# Patient Record
Sex: Female | Born: 1990 | Race: White | Hispanic: No | Marital: Married
Health system: Southern US, Community
[De-identification: ages and names within clinical notes are randomized; demographics above are authoritative.]

## PROBLEM LIST (undated history)

## (undated) DIAGNOSIS — J45909 Unspecified asthma, uncomplicated: Secondary | ICD-10-CM

## (undated) HISTORY — DX: Unspecified asthma, uncomplicated: J45.909

---
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
## 2013-03-16 ENCOUNTER — Other Ambulatory Visit (HOSPITAL_COMMUNITY)
Admission: RE | Admit: 2013-03-16 | Discharge: 2013-03-16 | Disposition: A | Discharge status: Home / Self Care | Payer: 59 | Source: Ambulatory Visit | Attending: Family Medicine | Admitting: Family Medicine

## 2013-03-16 DIAGNOSIS — Z124 Encounter for screening for malignant neoplasm of cervix: Secondary | ICD-10-CM | POA: Insufficient documentation

## 2013-03-16 DIAGNOSIS — Z113 Encounter for screening for infections with a predominantly sexual mode of transmission: Secondary | ICD-10-CM | POA: Insufficient documentation

## 2016-06-12 ENCOUNTER — Other Ambulatory Visit: Payer: Self-pay | Admitting: Family Medicine

## 2016-06-12 ENCOUNTER — Ambulatory Visit
Admission: RE | Admit: 2016-06-12 | Discharge: 2016-06-12 | Disposition: A | Discharge status: Home / Self Care | Payer: BLUE CROSS/BLUE SHIELD | Source: Ambulatory Visit | Attending: Family Medicine | Admitting: Family Medicine

## 2016-06-12 DIAGNOSIS — S99911A Unspecified injury of right ankle, initial encounter: Secondary | ICD-10-CM

## 2016-08-21 ENCOUNTER — Ambulatory Visit
Admission: RE | Admit: 2016-08-21 | Discharge: 2016-08-21 | Disposition: A | Discharge status: Home / Self Care | Payer: BLUE CROSS/BLUE SHIELD | Source: Ambulatory Visit | Attending: Family Medicine | Admitting: Family Medicine

## 2016-08-21 ENCOUNTER — Other Ambulatory Visit: Payer: Self-pay | Admitting: Family Medicine

## 2016-08-21 DIAGNOSIS — R059 Cough, unspecified: Secondary | ICD-10-CM

## 2016-08-21 DIAGNOSIS — R05 Cough: Secondary | ICD-10-CM

## 2018-06-09 ENCOUNTER — Ambulatory Visit: Payer: Managed Care, Other (non HMO) | Admitting: Physical Therapy

## 2018-06-13 ENCOUNTER — Ambulatory Visit: Payer: Managed Care, Other (non HMO) | Admitting: Physical Therapy

## 2018-06-15 ENCOUNTER — Other Ambulatory Visit: Payer: Self-pay

## 2018-06-15 ENCOUNTER — Encounter: Payer: Self-pay | Admitting: Physical Therapy

## 2018-06-15 ENCOUNTER — Ambulatory Visit: Payer: Managed Care, Other (non HMO) | Attending: Family Medicine | Admitting: Physical Therapy

## 2018-06-15 DIAGNOSIS — G8929 Other chronic pain: Secondary | ICD-10-CM

## 2018-06-15 DIAGNOSIS — M25511 Pain in right shoulder: Secondary | ICD-10-CM | POA: Insufficient documentation
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
## 2018-06-22 ENCOUNTER — Ambulatory Visit: Payer: Managed Care, Other (non HMO) | Admitting: Physical Therapy

## 2018-06-22 ENCOUNTER — Other Ambulatory Visit: Payer: Self-pay

## 2018-06-22 ENCOUNTER — Encounter: Payer: Self-pay | Admitting: Physical Therapy

## 2018-06-22 DIAGNOSIS — M25511 Pain in right shoulder: Secondary | ICD-10-CM | POA: Diagnosis not present

## 2018-06-22 DIAGNOSIS — G8929 Other chronic pain: Secondary | ICD-10-CM

## 2018-06-22 NOTE — Therapy (Signed)
[place], [place], [address] Phone: [phone]   Fax:  [phone]  Physical Therapy Treatment  Patient Details  Name: Anna Herring MRN: [id number] Date of Birth: [birth year] Referring Provider (PT): Jonathon Jordan, MD   Encounter Date: 06/22/2018  PT End of Session - 06/22/18 1332    Visit Number  2    Number of Visits  5    Date for PT Re-Evaluation  07/15/18    Authorization Type  [employer]IG[employer], no VL    PT Start Time  1330    PT Stop Time  1355    PT Time Calculation (min)  25 min    Activity Tolerance  Patient tolerated treatment well    Behavior During Therapy  [facility] for tasks assessed/performed       Past Medical History:  Diagnosis Date  . Asthma     Past Surgical History:  Procedure Laterality Date  . WISDOM TOOTH EXTRACTION Bilateral 2008    There were no vitals filed for this visit.  Subjective Assessment - 06/22/18 1333    Subjective  No pain today, exercises went well, got thoracic brace for being on computer.     Patient Stated Goals  avoid frozen shoulder    Currently in Pain?  No/denies                       [facility] Adult PT Treatment/Exercise - 06/22/18 0001      Shoulder Exercises: Prone   Other Prone Exercises  qped: CKC circles on ball, abd with head turn, GHJ flexion, plank row      Shoulder Exercises: Standing   Other Standing Exercises  wall angels, Ys, abd stretch               PT Short Term Goals - 06/15/18 1426      PT SHORT TERM GOAL #1   Title  STG = LTG        PT Long Term Goals - 06/15/18 1426      PT LONG TERM GOAL #1   Title  Pt will be independent in long term HEP    Baseline  began establishing at eval    Time  4    Period  Weeks    Status  New    Target Date  07/15/18      PT LONG TERM GOAL #2   Title  Pt will demo resting posture without scapular winging    Baseline  Rt winging at eval    Time  4    Period  Weeks     Status  New    Target Date  07/15/18      PT LONG TERM GOAL #3   Title  unform arc of motion in passive GHJ IR/ER    Baseline  ER significantly > IR at eval    Time  4    Period  Weeks    Status  New    Target Date  07/15/18            Plan - 06/22/18 1403    Clinical Impression Statement  Continued to add exercises for large ranges of motion as well as stabilization by encouraging periscapular activation. Good tolerance to exercises and options for progression and decreasing intensity. Will cancel appointment next week and f/u in 2 weeks.     PT Treatment/Interventions  ADLs/Self Care Home Management;Cryotherapy;Moist Heat;Iontophoresis 4mg /ml Dexamethasone;Electrical Stimulation;Functional mobility training;Neuromuscular re-education;Therapeutic exercise;Therapeutic  activities;Patient/family education;Manual techniques;Dry needling;Passive range of motion;Taping    PT Next Visit Plan  sleeper stretch, lifting, OH stabilization    PT Home Exercise Plan  plank serratus press, door stretch, ER red tband, triceps kick tband, scap retraction; see scanned pt instructions    Consulted and Agree with Plan of Care  Patient       Patient will benefit from skilled therapeutic intervention in order to improve the following deficits and impairments:  Improper body mechanics, Pain, Postural dysfunction, Decreased activity tolerance  Visit Diagnosis: Chronic right shoulder pain     Problem List There are no active problems to display for this patient.  Yaw Escoto C. Brennan Karam PT, DPT 06/22/18 2:13 PM   [facility] [address] [place], [place], [address] Phone: [phone]   Fax:  [phone]  Name: Anna Herring MRN: [id number] Date of Birth: Oct 18, 1990

## 2018-06-29 ENCOUNTER — Ambulatory Visit: Payer: Managed Care, Other (non HMO) | Admitting: Physical Therapy

## 2018-07-06 ENCOUNTER — Ambulatory Visit: Payer: Managed Care, Other (non HMO) | Admitting: Physical Therapy

## 2018-07-13 ENCOUNTER — Other Ambulatory Visit: Payer: Self-pay

## 2018-07-13 ENCOUNTER — Ambulatory Visit: Payer: Managed Care, Other (non HMO) | Attending: Family Medicine | Admitting: Physical Therapy

## 2018-07-13 ENCOUNTER — Encounter: Payer: Self-pay | Admitting: Physical Therapy

## 2018-07-13 DIAGNOSIS — M25511 Pain in right shoulder: Secondary | ICD-10-CM | POA: Diagnosis not present

## 2018-07-13 DIAGNOSIS — G8929 Other chronic pain: Secondary | ICD-10-CM | POA: Insufficient documentation

## 2018-07-13 NOTE — Therapy (Signed)
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 Asthma     Past Surgical History:  Procedure Laterality Date  . WISDOM TOOTH EXTRACTION Bilateral 2008    There were no vitals filed for this visit.  Subjective Assessment - 07/13/18 1629    Subjective  Denies pain overall.    Currently in Pain?  No/denies         [facility] PT Assessment - 07/13/18 0001      Assessment   Medical Diagnosis  Rt RC injury    Referring Provider (PT)  Jonathon Jordan, MD      PROM   Overall PROM Comments  equal arc of motion in passive IR/ER      Strength   Overall Strength Comments  gross strength 5/5      Palpation   Palpation comment  denies [facility]TP at [facility] joint                           PT Education - 07/13/18 1639    Education Details  importance of continuing HEP    Person(s) Educated  Patient    Methods  Explanation    Comprehension  Verbalized understanding       PT Short Term Goals - 06/15/18 1426      PT SHORT TERM GOAL #1   Title  STG = LTG        PT Long Term Goals - 07/13/18 1642      PT LONG TERM GOAL #1   Title  Pt will be independent in long term HEP    Status  Achieved      PT LONG TERM GOAL #2   Title  Pt will demo resting posture without scapular winging     Baseline  equal winging bilaterally rather than significant winging in Rt vs Lt    Status  Partially Met      PT LONG TERM GOAL #3   Title  unform arc of motion in passive GHJ IR/ER    Baseline  both to 90    Status  Achieved            Plan - 07/13/18 1639    Clinical Impression Statement  At this time, pt is doing well with her HEP and denies neck or shoulder pain. At eval, pt biggest limitation was an uneven arc of movement in Rt GHJ IR/ER which is resolved today. At rest, pt presents with resting scapular winging that has not changed with exercise and we discussed reasons for being aware of this posture. Pt will continue her HEP and contact us with any further questions.  PT Treatment/Interventions  ADLs/Self Care Home Management;Cryotherapy;Moist Heat;Iontophoresis 80m/ml Dexamethasone;Electrical Stimulation;Functional mobility training;Neuromuscular re-education;Therapeutic exercise;Therapeutic activities;Patient/family education;Manual techniques;Dry needling;Passive range of motion;Taping    PT Home Exercise Plan  plank serratus press, door stretch, ER red tband, triceps kick tband, scap retraction; see scanned pt instructions    Consulted and Agree with Plan of Care  Patient       Patient will benefit from skilled therapeutic intervention in order to improve the following deficits and impairments:  Improper body mechanics, Pain, Postural dysfunction, Decreased activity tolerance  Visit Diagnosis: 1. Chronic right shoulder pain        Problem List There are no active problems to display for this patient.  PHYSICAL THERAPY DISCHARGE SUMMARY  Visits from Start of Care: 3  Current functional level related to goals / functional outcomes: See above   Remaining deficits: See above   Education / Equipment: Anatomy of condition, POC, HEP, exercise form/rationale  Plan: Patient agrees to discharge.  Patient goals were met. Patient is being discharged due to being  pleased with the current functional level.  ?????     Betsi Crespi C. Benson Porcaro PT, DPT 07/13/18 4:43 PM   [facility] [address]Phone: [phone]  Fax:  [phone] Name: Anna KirshenbaumMRN: [id number] of Birth: 730-Jan-1992

## 2018-07-15 ENCOUNTER — Encounter

## 2018-07-19 IMAGING — CR DG CHEST 2V
2 series · 2 of 2 positions shown · non-contrast
Comparison: None.

CLINICAL DATA: Cough for 3 months.

EXAM:
CHEST  2 VIEW

[w chest pa]
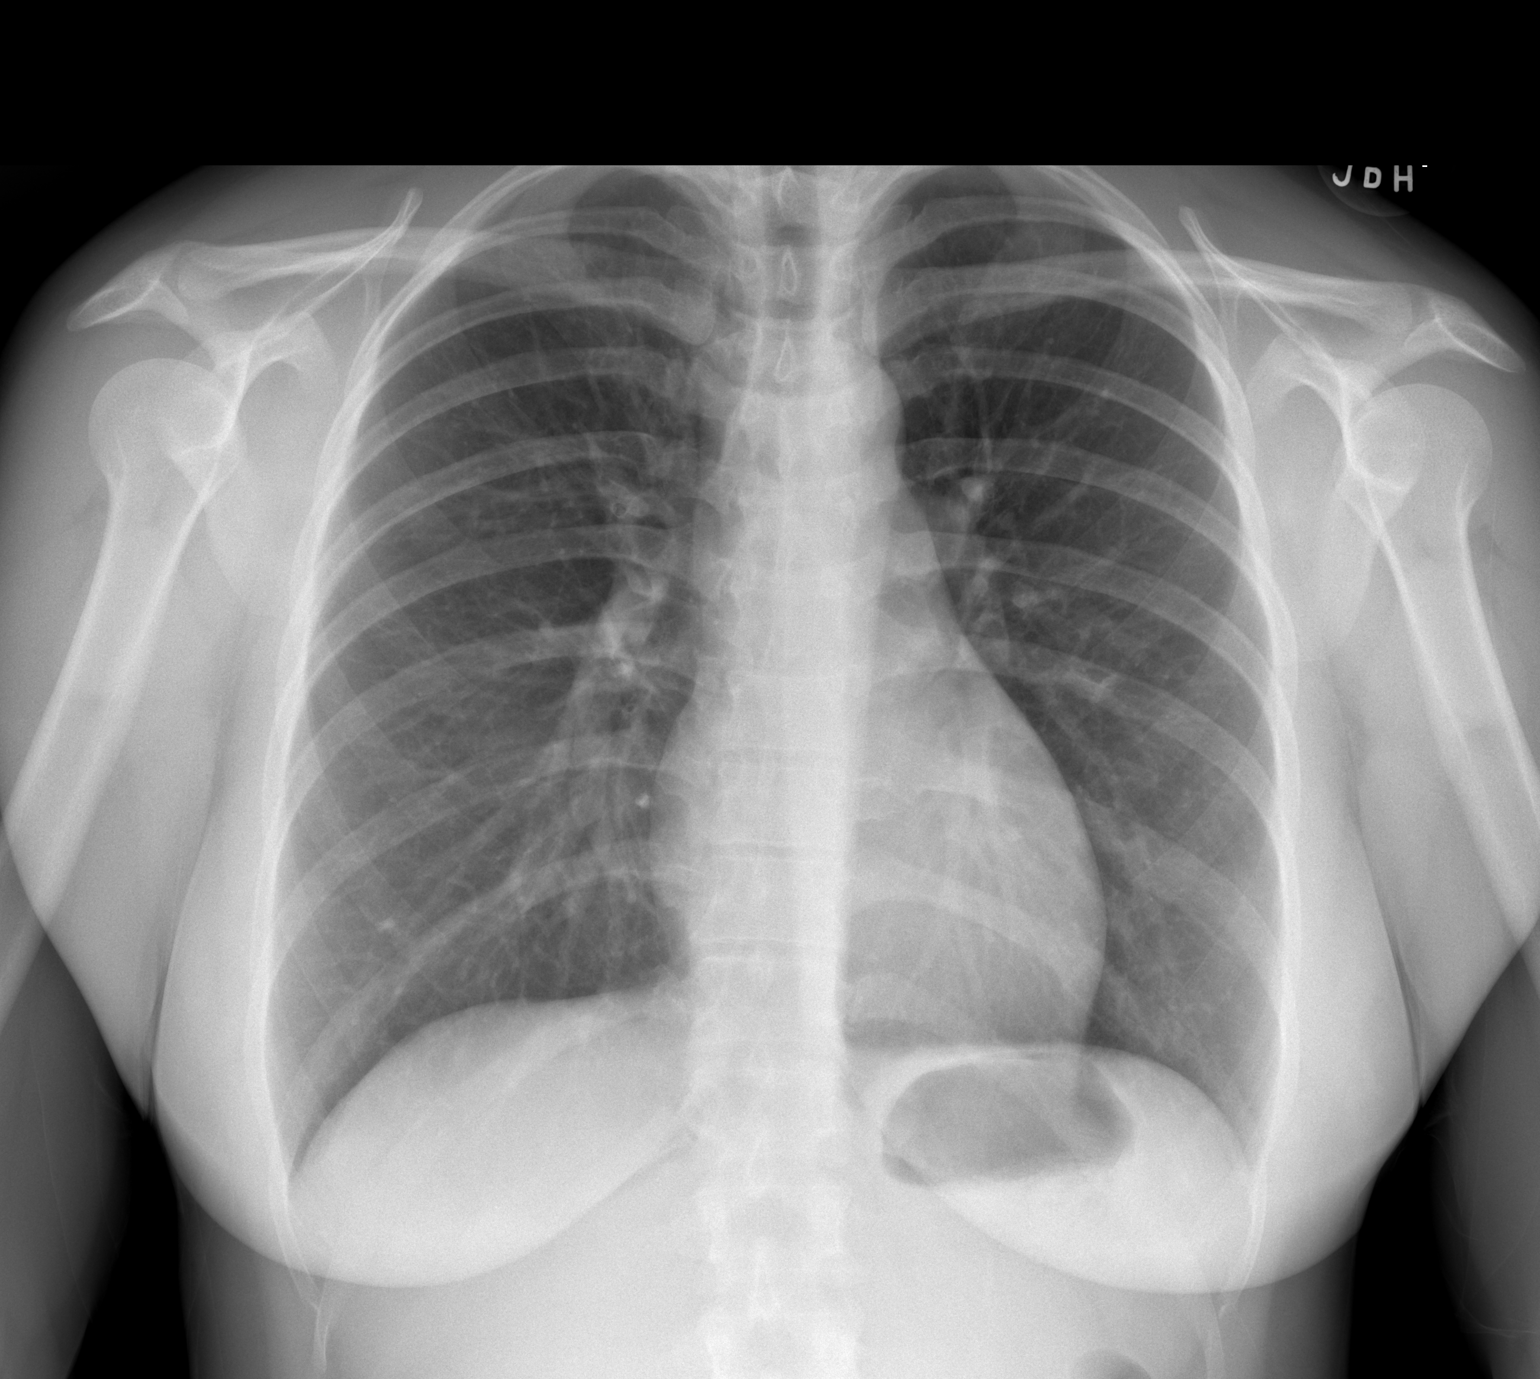

[w chest lat]
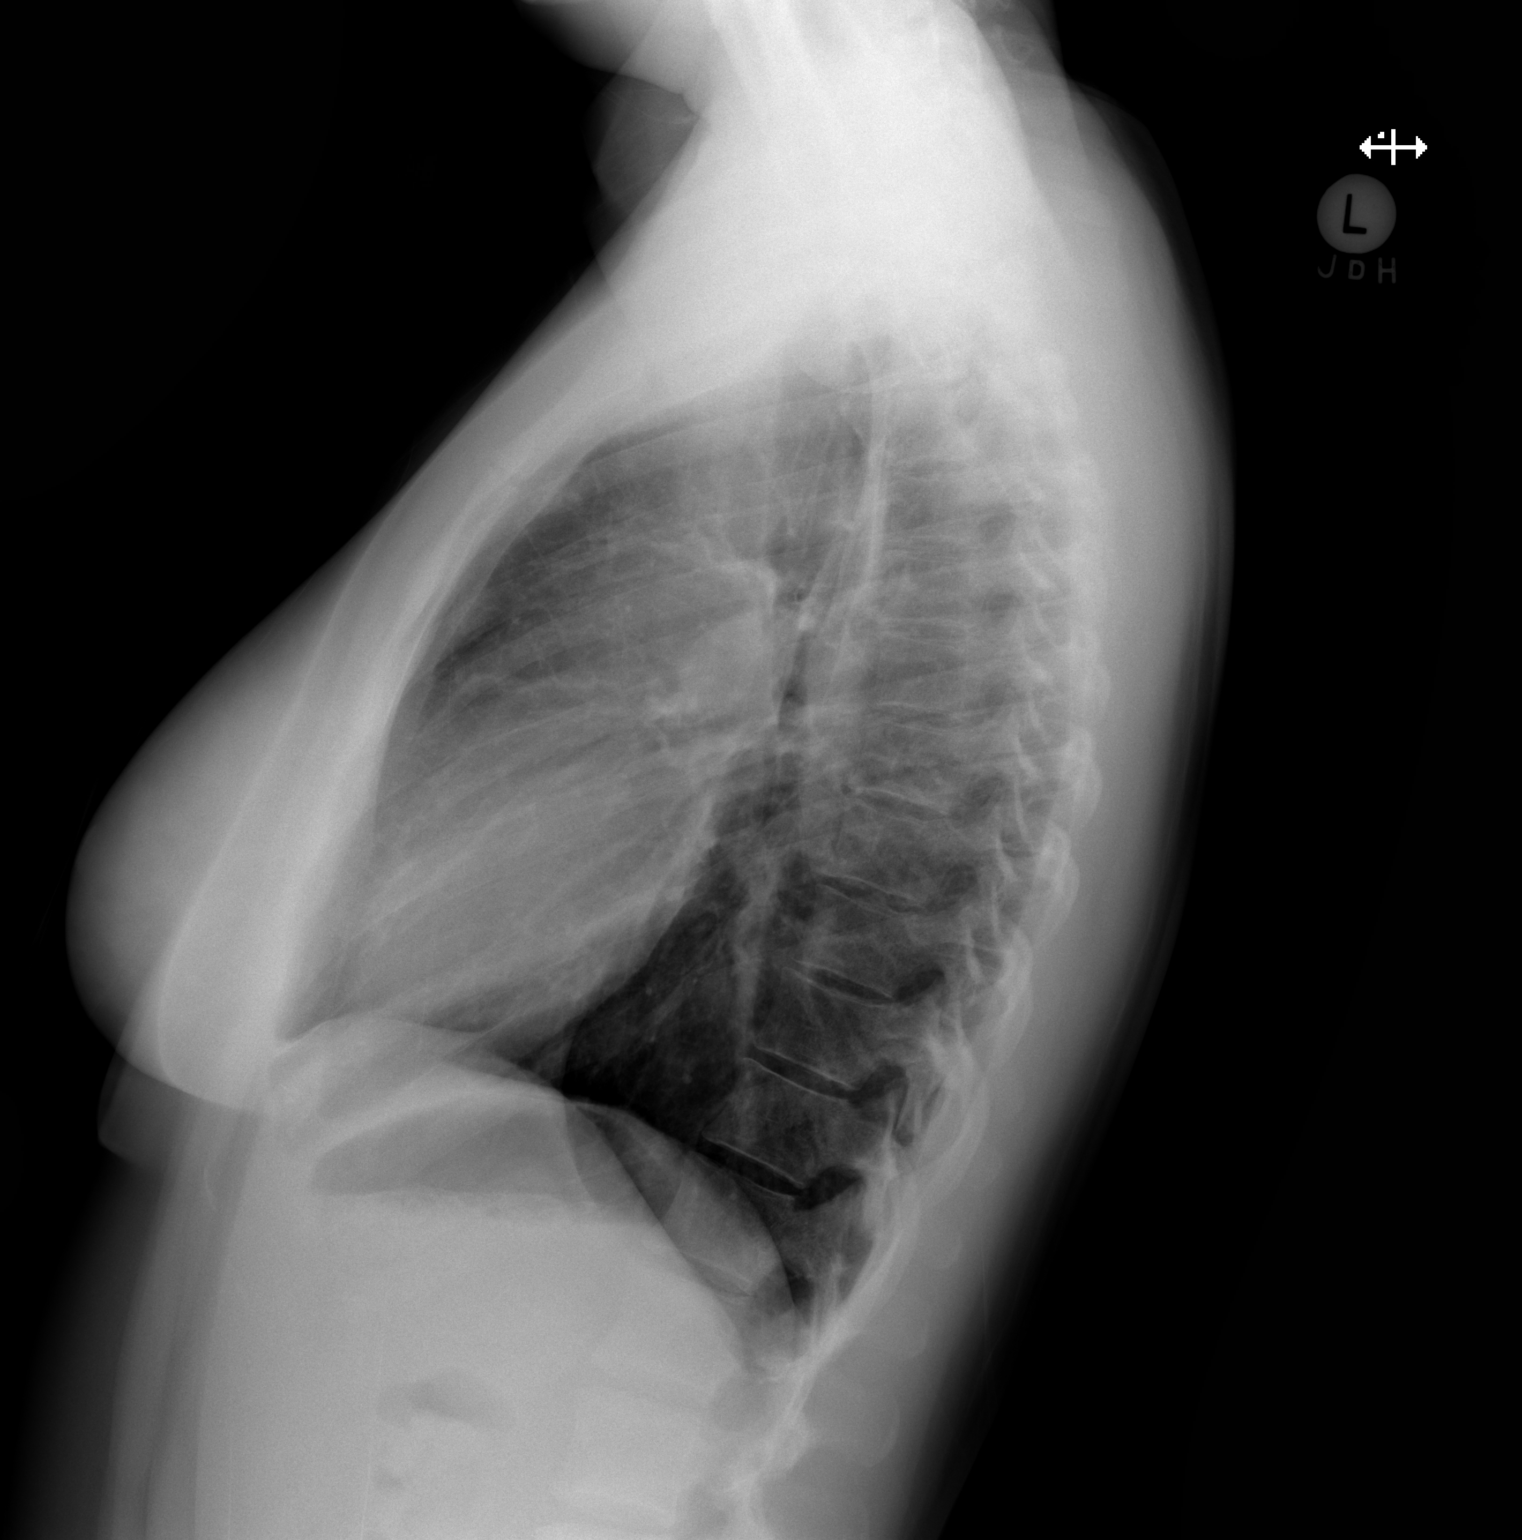

[2 of 2 positions shown; findings below may reference images not displayed]

FINDINGS: The heart size and mediastinal contours are within normal limits.
Both lungs are clear. The visualized skeletal structures are
unremarkable.
IMPRESSION: Negative.  No active cardiopulmonary disease.
